# Patient Record
Sex: Male | Born: 1985 | Marital: Married | Smoking: Current every day smoker
Health system: Southern US, Community
[De-identification: ages and names within clinical notes are randomized; demographics above are authoritative.]

## PROBLEM LIST (undated history)

## (undated) HISTORY — PX: HERNIA REPAIR: SHX51

---

## 2013-01-18 ENCOUNTER — Emergency Department (HOSPITAL_COMMUNITY)
Admission: EM | Admit: 2013-01-18 | Discharge: 2013-01-18 | Disposition: A | Discharge status: Home / Self Care | Attending: Emergency Medicine | Admitting: Emergency Medicine

## 2013-01-18 ENCOUNTER — Encounter (HOSPITAL_COMMUNITY): Payer: Self-pay | Admitting: *Deleted

## 2013-01-18 DIAGNOSIS — B309 Viral conjunctivitis, unspecified: Secondary | ICD-10-CM

## 2013-01-18 DIAGNOSIS — F17200 Nicotine dependence, unspecified, uncomplicated: Secondary | ICD-10-CM | POA: Insufficient documentation

## 2013-01-18 DIAGNOSIS — H579 Unspecified disorder of eye and adnexa: Secondary | ICD-10-CM | POA: Insufficient documentation

## 2013-01-18 NOTE — ED Notes (Signed)
Attempted to perform visual acuity test however pt isn't wearing his contacts or glasses and therefore wasn't able to read any line besides the 20/200 line in either eye. In fact , pt states that the letter of the 20/200 was of clearer visibility to his right eye than his left.

## 2013-01-18 NOTE — ED Notes (Signed)
Pt was at home watching TV when he felt his right eye itching. He wears contacts, and casually scratched his eye but it continued to irritate him and so pt removed contact from right eye and flushed eye with saline. An hour later the pt noticed his eye continued to itch and was swollen. Pt denies having contacted anything that may have caused the irritation to his knowledge and he doesn't feel like he injuried eye at some point to his recollection. Pt denies visual changes to either eye.

## 2013-01-18 NOTE — ED Provider Notes (Signed)
History  This chart was scribed for Paul Aguilar, a non-physician practitioner working with Loren Racer, MD by Lewanda Rife, ED Scribe. This patient was seen in room WTR6/WTR6 and the patient's care was started at 2220.     CSN: [id number]  Arrival date & time 01/18/13  2131   First MD Initiated Contact with Patient 01/18/13 2207      Chief Complaint  Patient presents with  . Eye Pain    (Consider location/radiation/quality/duration/timing/severity/associated sxs/prior treatment) The history is provided by the patient.   HPI Comments: Travion Ke is a 27 y.o. male who presents to the Emergency Department complaining of constant worsening pruritic right eye acute onset 2 hours PTA after removing prescription contacts. Reports clear drainage, increased tearing, and injected right eye. Denies recent injury to eye, fever, known foreign bodies in eye, change in vision, diplopia, and photophobia indirect and direct. Denies alleviating or aggravating factors. Denies taking any medications prior to arrival to treat symptoms.  History reviewed. No pertinent past medical history.  History reviewed. No pertinent past surgical history.  No family history on file.  History  Substance Use Topics  . Smoking status: Current Every Day Smoker -- 0.12 packs/day for 7 years    Types: Cigarettes  . Smokeless tobacco: Never Used  . Alcohol Use: No      Review of Systems A complete 10 system review of systems was obtained and all systems are negative except as noted in the HPI and PMH.    Allergies  Review of patient's allergies indicates no known allergies.  Home Medications  No current outpatient prescriptions on file.  BP 114/65  Pulse 72  Temp(Src) 98.1 F (36.7 C) (Oral)  Resp 18  Ht 5\' 8"  (1.727 m)  Wt 185 lb (83.915 kg)  BMI 28.14 kg/m2  SpO2 100%  Physical Exam  Nursing note and vitals reviewed. Constitutional: He is oriented to person, place, and  time. He appears well-developed and well-nourished. No distress.  HENT:  Head: Normocephalic and atraumatic.  Eyes: EOM and lids are normal. Pupils are equal, round, and reactive to light. No foreign bodies found. Right eye exhibits no discharge and no exudate. No foreign body present in the right eye. Left eye exhibits no discharge. Right conjunctiva is injected. Left conjunctiva is not injected.  Clear drainage noted. No fluorescence uptake of the right eye.  No corneal abrasion. IOP of right eye 15  Neck: Neck supple. No tracheal deviation present.  Cardiovascular: Normal rate and regular rhythm.   Pulmonary/Chest: Effort normal and breath sounds normal. No respiratory distress.  Musculoskeletal: Normal range of motion.  Neurological: He is alert and oriented to person, place, and time.  Skin: Skin is warm and dry.  Psychiatric: He has a normal mood and affect. His behavior is normal.    ED Course  Procedures (including critical care time) Medications - No data to display  Labs Reviewed - No data to display No results found.   No diagnosis found.    MDM  Patient presenting with injection of the right eye.  No purulent drainage or crusting of the eye.  Vision of the right eye same as the vision of the left eye.  IOP of the right eye is 15.  No corneal abrasion.  Symptoms most consistent with Viral Conjunctivitis.  Patient given referral to Ophthalmology.    I personally performed the services described in this documentation, which was scribed in my presence. The recorded information has been reviewed  and is accurate.   Pascal Lux Redrock, PA-C 01/18/13 2259

## 2013-01-18 NOTE — ED Provider Notes (Signed)
Medical screening examination/treatment/procedure(s) were performed by non-physician practitioner and as supervising physician I was immediately available for consultation/collaboration.   Merril Isakson, MD 01/18/13 2340 

## 2013-10-20 ENCOUNTER — Encounter (HOSPITAL_COMMUNITY): Payer: Self-pay | Admitting: Emergency Medicine

## 2013-10-20 ENCOUNTER — Emergency Department (HOSPITAL_COMMUNITY)
Admission: EM | Admit: 2013-10-20 | Discharge: 2013-10-20 | Discharge status: Against Medical Advice | Attending: Emergency Medicine | Admitting: Emergency Medicine

## 2013-10-20 DIAGNOSIS — R197 Diarrhea, unspecified: Secondary | ICD-10-CM | POA: Insufficient documentation

## 2013-10-20 DIAGNOSIS — R1110 Vomiting, unspecified: Secondary | ICD-10-CM | POA: Insufficient documentation

## 2013-10-20 DIAGNOSIS — R109 Unspecified abdominal pain: Secondary | ICD-10-CM | POA: Insufficient documentation

## 2013-10-20 DIAGNOSIS — F17200 Nicotine dependence, unspecified, uncomplicated: Secondary | ICD-10-CM | POA: Insufficient documentation

## 2013-10-20 DIAGNOSIS — J029 Acute pharyngitis, unspecified: Secondary | ICD-10-CM | POA: Insufficient documentation

## 2013-10-20 LAB — CBC WITH DIFFERENTIAL/PLATELET
BASOS PCT: 0 % (ref 0–1)
Basophils Absolute: 0 10*3/uL (ref 0.0–0.1)
EOS ABS: 0.1 10*3/uL (ref 0.0–0.7)
Eosinophils Relative: 1 % (ref 0–5)
HCT: 43.4 % (ref 39.0–52.0)
Hemoglobin: 15.1 g/dL (ref 13.0–17.0)
Lymphocytes Relative: 18 % (ref 12–46)
Lymphs Abs: 1.6 10*3/uL (ref 0.7–4.0)
MCH: 30.1 pg (ref 26.0–34.0)
MCHC: 34.8 g/dL (ref 30.0–36.0)
MCV: 86.6 fL (ref 78.0–100.0)
Monocytes Absolute: 1 10*3/uL (ref 0.1–1.0)
Monocytes Relative: 11 % (ref 3–12)
NEUTROS PCT: 70 % (ref 43–77)
Neutro Abs: 6.3 10*3/uL (ref 1.7–7.7)
PLATELETS: 179 10*3/uL (ref 150–400)
RBC: 5.01 MIL/uL (ref 4.22–5.81)
RDW: 13.3 % (ref 11.5–15.5)
WBC: 9 10*3/uL (ref 4.0–10.5)

## 2013-10-20 LAB — URINALYSIS, ROUTINE W REFLEX MICROSCOPIC
Bilirubin Urine: NEGATIVE
GLUCOSE, UA: NEGATIVE mg/dL
Hgb urine dipstick: NEGATIVE
KETONES UR: NEGATIVE mg/dL
LEUKOCYTES UA: NEGATIVE
Nitrite: NEGATIVE
PH: 5.5 (ref 5.0–8.0)
Protein, ur: NEGATIVE mg/dL
SPECIFIC GRAVITY, URINE: 1.034 — AB (ref 1.005–1.030)
Urobilinogen, UA: 0.2 mg/dL (ref 0.0–1.0)

## 2013-10-20 LAB — COMPREHENSIVE METABOLIC PANEL
ALBUMIN: 4.1 g/dL (ref 3.5–5.2)
ALK PHOS: 46 U/L (ref 39–117)
ALT: 39 U/L (ref 0–53)
AST: 21 U/L (ref 0–37)
BUN: 15 mg/dL (ref 6–23)
CO2: 22 mEq/L (ref 19–32)
Calcium: 8.9 mg/dL (ref 8.4–10.5)
Chloride: 101 mEq/L (ref 96–112)
Creatinine, Ser: 0.83 mg/dL (ref 0.50–1.35)
GFR calc Af Amer: >90 mL/min (ref >90)
GFR calc non Af Amer: >90 mL/min (ref >90)
Glucose, Bld: 97 mg/dL (ref 70–99)
POTASSIUM: 3.8 meq/L (ref 3.7–5.3)
SODIUM: 137 meq/L (ref 137–147)
TOTAL PROTEIN: 8.2 g/dL (ref 6.0–8.3)
Total Bilirubin: 0.8 mg/dL (ref 0.3–1.2)

## 2013-10-20 NOTE — ED Notes (Signed)
Pt states since Friday has had fever, lower abdominal pain, vomiting, diarrhea, headache, sorethroat and body aches

## 2013-10-20 NOTE — ED Notes (Signed)
Unable to locate at this time

## 2013-10-20 NOTE — ED Notes (Signed)
Unable to locate patient at this time.

## 2013-10-20 NOTE — ED Notes (Signed)
Patient was called several times and no answer.

## 2014-07-30 ENCOUNTER — Emergency Department (HOSPITAL_COMMUNITY)

## 2014-07-30 ENCOUNTER — Encounter (HOSPITAL_COMMUNITY): Payer: Self-pay | Admitting: Emergency Medicine

## 2014-07-30 ENCOUNTER — Emergency Department (HOSPITAL_COMMUNITY)
Admission: EM | Admit: 2014-07-30 | Discharge: 2014-07-31 | Disposition: A | Discharge status: Home / Self Care | Attending: Emergency Medicine | Admitting: Emergency Medicine

## 2014-07-30 DIAGNOSIS — R0600 Dyspnea, unspecified: Secondary | ICD-10-CM | POA: Diagnosis present

## 2014-07-30 DIAGNOSIS — T7809XA Anaphylactic reaction due to other food products, initial encounter: Secondary | ICD-10-CM | POA: Diagnosis not present

## 2014-07-30 DIAGNOSIS — R062 Wheezing: Secondary | ICD-10-CM | POA: Diagnosis not present

## 2014-07-30 DIAGNOSIS — R0602 Shortness of breath: Secondary | ICD-10-CM | POA: Diagnosis not present

## 2014-07-30 DIAGNOSIS — R600 Localized edema: Secondary | ICD-10-CM | POA: Diagnosis not present

## 2014-07-30 DIAGNOSIS — T782XXA Anaphylactic shock, unspecified, initial encounter: Secondary | ICD-10-CM

## 2014-07-30 DIAGNOSIS — Z720 Tobacco use: Secondary | ICD-10-CM | POA: Diagnosis not present

## 2014-07-30 LAB — BASIC METABOLIC PANEL
Anion gap: 16 — ABNORMAL HIGH (ref 5–15)
BUN: 20 mg/dL (ref 6–23)
CHLORIDE: 103 meq/L (ref 96–112)
CO2: 25 meq/L (ref 19–32)
Calcium: 9.7 mg/dL (ref 8.4–10.5)
Creatinine, Ser: 0.89 mg/dL (ref 0.50–1.35)
GFR calc Af Amer: >90 mL/min (ref >90)
GFR calc non Af Amer: >90 mL/min (ref >90)
GLUCOSE: 104 mg/dL — AB (ref 70–99)
POTASSIUM: 3.9 meq/L (ref 3.7–5.3)
Sodium: 144 mEq/L (ref 137–147)

## 2014-07-30 LAB — CBC
HEMATOCRIT: 45.5 % (ref 39.0–52.0)
HEMOGLOBIN: 15.8 g/dL (ref 13.0–17.0)
MCH: 29.9 pg (ref 26.0–34.0)
MCHC: 34.7 g/dL (ref 30.0–36.0)
MCV: 86 fL (ref 78.0–100.0)
Platelets: 205 10*3/uL (ref 150–400)
RBC: 5.29 MIL/uL (ref 4.22–5.81)
RDW: 13.2 % (ref 11.5–15.5)
WBC: 11.6 10*3/uL — ABNORMAL HIGH (ref 4.0–10.5)

## 2014-07-30 MED ORDER — FAMOTIDINE IN NACL 20-0.9 MG/50ML-% IV SOLN
20.0000 mg | Freq: Once | INTRAVENOUS | Status: AC
  Administered 2014-07-30: 20 mg via INTRAVENOUS
  Filled 2014-07-30: qty 50

## 2014-07-30 MED ORDER — DIPHENHYDRAMINE HCL 50 MG/ML IJ SOLN
50.0000 mg | Freq: Once | INTRAMUSCULAR | Status: AC
  Administered 2014-07-30: 50 mg via INTRAVENOUS
  Filled 2014-07-30: qty 1

## 2014-07-30 MED ORDER — ALBUTEROL SULFATE (2.5 MG/3ML) 0.083% IN NEBU
5.0000 mg | INHALATION_SOLUTION | Freq: Once | RESPIRATORY_TRACT | Status: AC
  Administered 2014-07-30: 5 mg via RESPIRATORY_TRACT
  Filled 2014-07-30: qty 6

## 2014-07-30 MED ORDER — METHYLPREDNISOLONE SODIUM SUCC 125 MG IJ SOLR
125.0000 mg | Freq: Once | INTRAMUSCULAR | Status: AC
  Administered 2014-07-30: 125 mg via INTRAVENOUS
  Filled 2014-07-30: qty 2

## 2014-07-30 MED ORDER — EPINEPHRINE 0.3 MG/0.3ML IJ SOAJ
INTRAMUSCULAR | Status: AC
  Administered 2014-07-30: 21:00:00
  Filled 2014-07-30: qty 0.3

## 2014-07-30 MED ORDER — SODIUM CHLORIDE 0.9 % IV BOLUS (SEPSIS)
1000.0000 mL | INTRAVENOUS | Status: AC
Start: 2014-07-30 — End: 2014-07-30
  Administered 2014-07-30: 1000 mL via INTRAVENOUS

## 2014-07-30 NOTE — ED Provider Notes (Signed)
CSN: [id number]     Arrival date & time 07/30/14  2059 History   First MD Initiated Contact with Patient 07/30/14 2111     Chief Complaint  Patient presents with  . Allergic Reaction     (Consider location/radiation/quality/duration/timing/severity/associated sxs/prior Treatment) Patient is a 28 y.o. male presenting with allergic reaction. The history is provided by the patient.  Allergic Reaction Presenting symptoms: difficulty breathing and swelling   Difficulty breathing:    Severity:  Mild   Onset quality:  Sudden   Duration:  1 hour   Timing:  Constant   Progression:  Unchanged Swelling:    Location: lips.   Onset quality:  Sudden   Duration:  1 hour   Timing:  Constant   Progression:  Unchanged   Chronicity:  New Severity:  Mild Prior allergic episodes:  No prior episodes Context: food   Relieved by:  Nothing Worsened by:  Nothing tried Ineffective treatments: benadryl po pta.   History reviewed. No pertinent past medical history. Past Surgical History  Procedure Laterality Date  . Hernia repair     No family history on file. History  Substance Use Topics  . Smoking status: Current Every Day Smoker -- 0.12 packs/day for 7 years    Types: Cigarettes  . Smokeless tobacco: Never Used  . Alcohol Use: No    Review of Systems  Constitutional: Negative for fever.  HENT: Negative for drooling and rhinorrhea.        Facial swelling  Eyes: Negative for pain.  Respiratory: Positive for shortness of breath. Negative for cough.   Cardiovascular: Negative for chest pain and leg swelling.  Gastrointestinal: Negative for nausea, vomiting, abdominal pain and diarrhea.  Genitourinary: Negative for dysuria and hematuria.  Musculoskeletal: Negative for gait problem and neck pain.  Skin: Negative for color change.  Neurological: Negative for numbness and headaches.  Hematological: Negative for adenopathy.  Psychiatric/Behavioral: Negative for behavioral problems.  All  other systems reviewed and are negative.     Allergies  Review of patient's allergies indicates no known allergies.  Home Medications   Prior to Admission medications   Not on File   BP 136/105 mmHg  Pulse 96  Temp(Src) 98 F (36.7 C) (Oral)  Resp 34  SpO2 100% Physical Exam  Constitutional: He is oriented to person, place, and time. He appears well-developed and well-nourished.  HENT:  Head: Normocephalic and atraumatic.  Right Ear: External ear normal.  Left Ear: External ear normal.  Nose: Nose normal.  Mouth/Throat: Oropharynx is clear and moist. No oropharyngeal exudate.  Mild swelling of the lips. No obvious tongue or oropharyngeal swelling.  Eyes: Conjunctivae and EOM are normal. Pupils are equal, round, and reactive to light.  Neck: Normal range of motion. Neck supple.  Cardiovascular: Normal rate, regular rhythm, normal heart sounds and intact distal pulses.  Exam reveals no gallop and no friction rub.   No murmur heard. Pulmonary/Chest: He is in respiratory distress. He has wheezes (diffuse mild wheezing).  Abdominal: Soft. Bowel sounds are normal. He exhibits no distension. There is no tenderness. There is no rebound and no guarding.  Musculoskeletal: Normal range of motion. He exhibits no edema or tenderness.  Neurological: He is alert and oriented to person, place, and time.  Skin: Skin is warm and dry.  Psychiatric: He has a normal mood and affect. His behavior is normal.  Nursing note and vitals reviewed.   ED Course  Procedures (including critical care time) Labs Review Labs Reviewed  CBC - Abnormal; Notable for the following:    WBC 11.6 (*)    All other components within normal limits  BASIC METABOLIC PANEL - Abnormal; Notable for the following:    Glucose, Bld 104 (*)    Anion gap 16 (*)    All other components within normal limits    Imaging Review Dg Chest Port 1 View  07/30/2014   CLINICAL DATA:  Extreme shortness of breath today.  EXAM:  PORTABLE CHEST - 1 VIEW  COMPARISON:  None.  FINDINGS: The heart size and mediastinal contours are within normal limits. Both lungs are clear. The visualized skeletal structures are unremarkable.  IMPRESSION: No active disease.   Electronically Signed   By: Burman NievesWilliam  Stevens M.D.   On: 07/30/2014 21:33     EKG Interpretation   Date/Time:  Thursday July 30 2014 21:05:57 EST Ventricular Rate:  98 PR Interval:  142 QRS Duration: 78 QT Interval:  336 QTC Calculation: 429 R Axis:   67 Text Interpretation:  Sinus rhythm Confirmed by Jorgeluis Gurganus  MD, Ankita Newcomer  ([id number]) on 07/30/2014 9:16:29 PM     CRITICAL CARE Performed by: Purvis SheffieldHARRISON, Daemyn Gariepy, S Total critical care time: 30 min Critical care time was exclusive of separately billable procedures and treating other patients. Critical care was necessary to treat or prevent imminent or life-threatening deterioration. Critical care was time spent personally by me on the following activities: development of treatment plan with patient and/or surrogate as well as nursing, discussions with consultants, evaluation of patient's response to treatment, examination of patient, obtaining history from patient or surrogate, ordering and performing treatments and interventions, ordering and review of laboratory studies, ordering and review of radiographic studies, pulse oximetry and re-evaluation of patient's condition.  MDM   Final diagnoses:  SOB (shortness of breath)  Anaphylaxis, initial encounter    9:16 PM 28 y.o. male who presents with anaphylaxis. The patient states that he ate some fried chicken and Pinto beans around 7:30 PM this evening. He states that he began having some lip swelling around 8 PM and developed shortness of breath at that time. He is able to handle his secretions. He is afebrile and tachypneic here. He denies any known allergies. He took 2 Benadryl prior to arrival. [facility], Pepcid, slight Medrol, and EpiPen. We'll  also give albuterol in the give and wheezing on exam.  Pt's resp sx have resolved. Mild lip swelling remains. Return precautions discussed. Will send home on steroids, benadryl prn, and Rx for epipen. Plan for obs for 1 more hour. Pt warned about biphasic rxn's. Dr. Norlene Campbelltter to d/c in 1 hr if pt continues to appear well.     Purvis SheffieldForrest Tanayia Wahlquist, MD 07/31/14 2107

## 2014-07-30 NOTE — ED Notes (Signed)
Pt states he started having difficulty breathing and facial swelling noted after eating fried chicken around 8pm  Pt states he took 2 benadryl tablets prior to coming here  Denies any problems swallowing

## 2014-07-31 MED ORDER — PREDNISONE 20 MG PO TABS: ORAL_TABLET | ORAL | Status: DC

## 2014-07-31 MED ORDER — EPINEPHRINE 0.3 MG/0.3ML IJ SOAJ: 0.3000 mg | Freq: Once | INTRAMUSCULAR | Status: AC

## 2014-07-31 NOTE — ED Notes (Signed)
Patient sleeping but is arousable. Patient wife states her husband had a couple bites of chicken and pinto beans at [employer] around 2000 and began to have oral swelling with SOB. Patient took 2 Benadryl PTA. Patient with occasional jerking motions at this time. Patient states he feels better. Patient upper lip remains swollen. Patient denies any respiratory difficulties.

## 2014-07-31 NOTE — ED Provider Notes (Signed)
Care assumed from Dr. Romeo AppleHarrison and changes shift.  Patient with allergic reaction, has received epinephrine.  Patient reassessed, he has mild swelling to the upper lip.  He denies any difficulties breathing or swallowing.  Wife requests work note.  Olivia Mackielga M Ruthmary Occhipinti, MD 07/31/14 [phone]

## 2014-07-31 NOTE — Discharge Instructions (Signed)
Anaphylactic Reaction An anaphylactic reaction is a sudden, severe allergic reaction. It affects the whole body. It can be life threatening. You may need to stay in the hospital.  [place] a medical bracelet or necklace that lists your allergy.  Carry your allergy kit or medicine shot to treat severe allergic reactions with you. These can save your life.  Do not drive until medicine from your shot has worn off, unless your doctor says it is okay.  If you have hives or a rash:  Take medicine as told by your doctor.  You may take over-the-counter antihistamine medicine.  Place cold cloths on your skin. Take baths in cool water. Avoid hot baths and hot showers. GET HELP RIGHT AWAY IF:   Your mouth is puffy (swollen), or you have trouble breathing.  You start making whistling sounds when you breathe (wheezing).  You have a tight feeling in your chest or throat.  You have a rash, hives, puffiness, or itching on your body.  You throw up (vomit) or have watery poop (diarrhea).  You feel dizzy or pass out (faint).  You think you are having an allergic reaction.  You have new symptoms. This is an emergency. Use your medicine shot or allergy kit as told. Call your local emergency services (911 in U.S[place] Even if you feel better after the shot, you need to go to the hospital emergency department. MAKE SURE YOU:   Understand these instructions.  Will watch your condition.  Will get help right away if you are not doing well or get worse. Document Released: 02/14/2008 Document Revised: 02/27/2012 Document Reviewed: 11/29/2011 [facility] Patient Information 2015 [place], [place]. This information is not intended to replace advice given to you by your health care provider. Make sure you discuss any questions you have with your health care provider.   Emergency Department Resource Guide 1) Find a Doctor and Pay Out of Pocket Although you won't have to find out who is covered by your  insurance plan, it is a good idea to ask around and get recommendations. You will then need to call the office and see if the doctor you have chosen will accept you as a new patient and what types of options they offer for patients who are self-pay. Some doctors offer discounts or will set up payment plans for their patients who do not have insurance, but you will need to ask so you aren't surprised when you get to your appointment.  2) Contact Your Local Health Department Not all health departments have doctors that can see patients for sick visits, but many do, so it is worth a call to see if yours does. If you don't know where your local health department is, you can check in your phone book. The CDC also has a tool to help you locate your state's health department, and many state websites also have listings of all of their local health departments.  3) Find a [facility] If your illness is not likely to be very severe or complicated, you may want to try a walk in clinic. These are popping up all over the country in pharmacies, drugstores, and shopping centers. They're usually staffed by nurse practitioners or physician assistants that have been trained to treat common illnesses and complaints. They're usually fairly quick and inexpensive. However, if you have serious medical issues or chronic medical problems, these are probably not your best option.  No Primary Care Doctor: - Call Health Connect at  [phone] -  they can help you locate a primary care doctor that  accepts your insurance, provides certain services, etc. - Physician Referral Service- [phone]  Chronic Pain Problems: Organization         Address  Phone   Notes  [facility]  [phone] Patients need to be referred by their primary care doctor.   Medication Assistance: Organization         Address  Phone   Notes  [facility] Medication [facility] [address] [place], [place] [address] [phone] --Must be a resident of [facility] -- Must have NO insurance coverage whatsoever (no Medicaid/ Medicare, etc.) -- The pt. MUST have a primary care doctor that directs their care regularly and follows them in the community   MedAssist  [phone]   [employer]  [phone]    Agencies that provide inexpensive medical care: Organization         Address  Phone   Notes  [place]  [phone]   [facility]    [phone]   [facility] [place], [place] [address] [phone]   [place] [address], [place] [phone]   Planned Parenthood    [phone]   [facility]    [phone]   [place] and [place] [address], [place] Phone:  [phone], Fax:  [phone] Hours of Operation:  9 am - 6 pm, M-F.  Also accepts Medicaid/Medicare and self-pay.  [facility] for [place] [address], [address], [place] Phone: [phone], Fax: [phone]. Hours of Operation:  8:30 am - 5:30 pm, M-F.  Also accepts Medicaid and self-pay.  [facility] [address], [place] Phone: [phone]   [place], [place], [place] [phone], Ext. [phone] Mondays & Thursdays: 7-9 AM.  First 15 patients are seen on a first come, first serve basis.    [place] Providers:  Organization         Address  Phone   Notes  [facility] [address], [place] [phone] Also accepts self-pay patients.  [facility] [address], [place]  [phone]   [place], [address], [place] [phone]   [facility] [address] [place] [phone]   Lucianne Lei [address], [place]   [phone] Only accepts [place] Access [place] patients after they have their name applied to their card.   Self-Pay (no insurance) in [facility]:  Organization         Address  Phone   Notes  Sickle Cell Patients, [facility] [place] [phone]   [facility] [place] [phone]   [facility] [place]  [place], [place], [place] [phone]   [facility]/Dr. Osei-Bonsu  [address] [place] or [address], [place] [phone] Phone number for both [place] and [place] locations is the same.  Urgent Medical and [facility] [address], [place] [phone]   [facility] [place] or [place]  [facility] [address] [phone]   [facility] [place] [phone], phone; [phone], fax Sees patients 1st and 3rd Saturday of every month.  Must not qualify for public or private insurance (i.e. Medicaid, Medicare, [place] Health Choice, Veterans' Benefits)  Household income should be no more than 200% of the poverty level The clinic cannot treat you if you are pregnant or think you are pregnant  Sexually transmitted diseases are not treated at the clinic.    Dental Care: Organization         Address  Phone  Notes  [facility] Department of [facility] [place] [phone] Accepts children up to age 43 who are enrolled in [place] or [place]; pregnant women with a Medicaid card; and children who have applied for Medicaid or [place] Health Choice, but were declined, whose parents can pay a reduced fee at time of service.  [facility] Department of [facility]  [address], [place] [phone] Accepts children up to age 15 who are enrolled  in [place] or [place]; pregnant women with a Medicaid card; and children who have applied for Medicaid or  Health Choice, but were declined, whose parents can pay a reduced fee at time of service.  [place] Adult Dental Access PROGRAM  [place] [phone] Patients are seen by appointment only. Walk-ins are not accepted. [place] will see patients 12 years of age and older. Monday - Tuesday (8am-5pm) Most Wednesdays (8:30-5pm) $30 per visit, cash only  [facility] Adult Dental Access PROGRAM  [address], [facility] [phone] Patients are seen by appointment only. Walk-ins are not accepted. [place] will see patients 47 years of age and older. One Wednesday Evening (Monthly: Volunteer Based).  $30 per visit, cash only  [place]  [phone] for adults; Children under age 81, call Graduate Pediatric Dentistry at [phone]. Children aged 50-14, please call [phone] to request a pediatric application.  Dental services are provided in all areas of dental care including fillings, crowns and bridges, complete and partial dentures, implants, gum treatment, root canals, and extractions. Preventive care is also provided. Treatment is provided to both adults and children. Patients are selected via a lottery and there is often a waiting list.   [facility] [address] [place]  [phone] [email or web address]   [facility] [address] [place], [place] [phone], Ext. [phone] Second and Fourth Thursday of each month, opens at 6:30 AM; Clinic ends at 9 AM.  Patients are seen on a first-come first-served basis, and a limited number are seen during each clinic.   [facility]  [address] Hillard Danker [place], [place] [phone]   Eligibility Requirements You must have lived in [place], [place], or [place] counties for at least the last three months.   You cannot be  eligible for state or federal sponsored [employer], including [employer], [place], or [employer].   You generally cannot be eligible for healthcare insurance through your employer.    How to apply: Eligibility screenings are held every Tuesday and Wednesday afternoon from 1:00 pm until 4:00 pm. You do not need an appointment for the interview!  [facility] [address], [place], [place]   [place]  [place]  Health Department  [place]  [phone]    Behavioral Health Resources in the Community: Intensive Outpatient Programs Organization         Address  Phone  Notes  [place]. [address], [place], [place] [phone]   [facility]patient [address] [place], [place]   ADS: Alcohol & Drug Svcs [address], [place], [place]   [place] [address],  [place], [place] or [phone]   Substance Abuse Resources Organization         Address  Phone  Notes  Alcohol and Drug Services  [phone]   [place]  [phone]   The [place]   Chinita Pester  [phone]   Residential & Outpatient Substance Abuse Program  [phone]   Psychological Services Organization         Address  Phone  Notes  [facility] [place]  Reserve  [phone]   [place] [address], [place] or [phone]    Mobile Crisis Teams Organization         Address  Phone  Notes  [facility], Mobile Crisis Care Unit  [phone]   [facility]  [address] [place], [place]   [place] Levels [address] [place] [phone]    Self-Help/Support Groups Organization          Address  Phone             Notes  [place]. of [place] - variety of support groups  [place] Call for more information  Narcotics Anonymous (NA), [facility] [address], [employer] [place]  2 meetings at this location   Special educational needs teacher         Address  Phone  Notes  [facility] Residential Treatment [place],    [place]  [phone]   [facility]  [address], [place] [phone], [place], [place]   [place], [place] [phone] Admissions: 8am-3pm M-F  Incentives Substance [place] [address],    [place], [place] [phone]   [facility] [address] [place], [place], [place]   [facility] [address]  [place], [place]   Insight Programs - Intensive Outpatient [address], [place], [place]   [facility] ([place].,  [place], [place] [phone] or [phone]   [facility] Services (RTS) [address], [place], [place] Accepts Medicaid  [facility] [place] [address],  [place] [phone] Substance Abuse/Addiction Treatment   [facility] Organization         Address  Phone  Notes  [facility]  [phone]   Domenic Schwab, PhD [address] Arlis Porta [place], [place]   [phone] or [phone]   [place], [place] [phone]   [place] [address], [place], [place] [phone] Insurance/Medicaid/sponsorship through [employer] and Families [address]  [place], [place] [phone] [place], [place] [phone]    Dr. Adele Schilder  [phone]   [facility] Dept. 1) [address] [place] 2) [place] 3)  [place] [address], [place] [phone]  [phone]   [place] [phone] or [phone] (After Hours)

## 2014-10-12 ENCOUNTER — Emergency Department (HOSPITAL_COMMUNITY)
Admission: EM | Admit: 2014-10-12 | Discharge: 2014-10-12 | Disposition: A | Discharge status: Home / Self Care | Attending: Emergency Medicine | Admitting: Emergency Medicine

## 2014-10-12 ENCOUNTER — Encounter (HOSPITAL_COMMUNITY): Payer: Self-pay | Admitting: Emergency Medicine

## 2014-10-12 DIAGNOSIS — R21 Rash and other nonspecific skin eruption: Secondary | ICD-10-CM | POA: Diagnosis not present

## 2014-10-12 DIAGNOSIS — Z720 Tobacco use: Secondary | ICD-10-CM | POA: Diagnosis not present

## 2014-10-12 DIAGNOSIS — Z79899 Other long term (current) drug therapy: Secondary | ICD-10-CM | POA: Diagnosis not present

## 2014-10-12 MED ORDER — HYDROXYZINE HCL 25 MG PO TABS
25.0000 mg | ORAL_TABLET | Freq: Once | ORAL | Status: AC
  Administered 2014-10-12: 25 mg via ORAL
  Filled 2014-10-12: qty 1

## 2014-10-12 MED ORDER — PREDNISONE 20 MG PO TABS: 20.0000 mg | ORAL_TABLET | Freq: Every day | ORAL | Status: AC

## 2014-10-12 MED ORDER — HYDROXYZINE HCL 25 MG PO TABS: 25.0000 mg | ORAL_TABLET | Freq: Four times a day (QID) | ORAL | Status: AC

## 2014-10-12 NOTE — Discharge Instructions (Signed)
Call for a follow up appointment with a Family or Primary Care Provider.  Return if Symptoms worsen.   Take medication as prescribed.  You can take benadryl or vistaril for itching discomfort, do not take both.

## 2014-10-12 NOTE — ED Notes (Signed)
Per pt, states rash and small bumps since Sat.-on back, torso, and underarms

## 2014-10-12 NOTE — ED Provider Notes (Signed)
CSN: [id number]     Arrival date & time 10/12/14  0944 History   First MD Initiated Contact with Patient 10/12/14 1041     Chief Complaint  Patient presents with  . Rash     (Consider location/radiation/quality/duration/timing/severity/associated sxs/prior Treatment) HPI Comments: The patient is a 29 year old male no known allergies presents emergency department the chief complaint of rash for 2 days. Patient reports to erythemic bumps on back on Saturday. He reports similar lesions under bilateral arms, resolved. He reports persistent lesion on left upper back. He reports associated pruritus. Patient denies known recent contact. Specifically denies new soaps, lotions, detergents, animal exposure, bug exposure, food exposure, similar rash on other family members. Pt denies similar rash in the past. Patient denies perioral paresthesias, throat or tongue swelling, dyspnea.  Reports history of perioral discomfort and throat swelling after [employer], denies similar symptoms.  Patient is a 29 y.o. male presenting with rash. The history is provided by the patient. No language interpreter was used.  Rash Associated symptoms: no fever, no shortness of breath, no sore throat and not wheezing     History reviewed. No pertinent past medical history. Past Surgical History  Procedure Laterality Date  . Hernia repair     No family history on file. History  Substance Use Topics  . Smoking status: Current Every Day Smoker -- 0.12 packs/day for 7 years    Types: Cigarettes  . Smokeless tobacco: Never Used  . Alcohol Use: No    Review of Systems  Constitutional: Negative for fever and chills.  HENT: Negative for facial swelling, sore throat and trouble swallowing.   Respiratory: Negative for cough, shortness of breath, wheezing and stridor.   Skin: Positive for rash.      Allergies  Review of patient's allergies indicates no known allergies.  Home Medications   Prior to Admission  medications   Medication Sig Start Date End Date Taking? Authorizing Provider  diphenhydrAMINE (BENADRYL) 25 MG tablet Take 50 mg by mouth once.    Historical Provider, MD  EPINEPHrine 0.3 mg/0.3 mL IJ SOAJ injection Inject 0.3 mLs (0.3 mg total) into the muscle once. 07/31/14   Purvis SheffieldForrest Harrison, MD  predniSONE (DELTASONE) 20 MG tablet Take 3 tablets on day 1. Take 2 tablets on day 2. Take 1 tablet on day 3. 07/31/14   Purvis SheffieldForrest Harrison, MD  sodium chloride (OCEAN) 0.65 % SOLN nasal spray Place 1 spray into both nostrils as needed for congestion.    Historical Provider, MD   BP 124/75 mmHg  Pulse 75  Temp(Src) 98 F (36.7 C) (Oral)  Resp 18  SpO2 100% Physical Exam  Constitutional: He is oriented to person, place, and time. He appears well-developed and well-nourished. No distress.  HENT:  Head: Normocephalic and atraumatic.  Neck: Neck supple.  Pulmonary/Chest: Effort normal. No respiratory distress.  Patient is able to speak in complete sentences.   Neurological: He is oriented to person, place, and time.  Skin: Skin is warm and dry. Rash noted.     Erythremic lesion to left upper back, no excoriation noted, no drainage. Mild swelling, noted increase in warmth to touch.  Psychiatric: He has a normal mood and affect. His behavior is normal.  Nursing note and vitals reviewed.   ED Course  Procedures (including critical care time) Labs Review Labs Reviewed - No data to display  Imaging Review No results found.   EKG Interpretation None      MDM   Final diagnoses:  Rash  Patient with pruritic rash, no other complaints. One lesion on upper back, no increase in warmth. Plan to treat with prednisone and Benadryl or Vistaril for symptoms. Plan to follow-up with PCP. Meds given in ED:  Medications  hydrOXYzine (ATARAX/VISTARIL) tablet 25 mg (25 mg Oral Given 10/12/14 1135)    Discharge Medication List as of 10/12/2014 11:28 AM    START taking these medications   Details    hydrOXYzine (ATARAX/VISTARIL) 25 MG tablet Take 1 tablet (25 mg total) by mouth every 6 (six) hours., Starting 10/12/2014, Until Discontinued, Print         Mellody Drown, PA-C 10/12/14 1544  Geoffery Lyons, MD 10/13/14 [phone]

## 2015-01-12 IMAGING — DX DG CHEST 1V PORT
1 series · 1 of 1 positions shown · non-contrast
Comparison: None.

CLINICAL DATA: Extreme shortness of breath today.

EXAM:
PORTABLE CHEST - 1 VIEW

[chest ap]
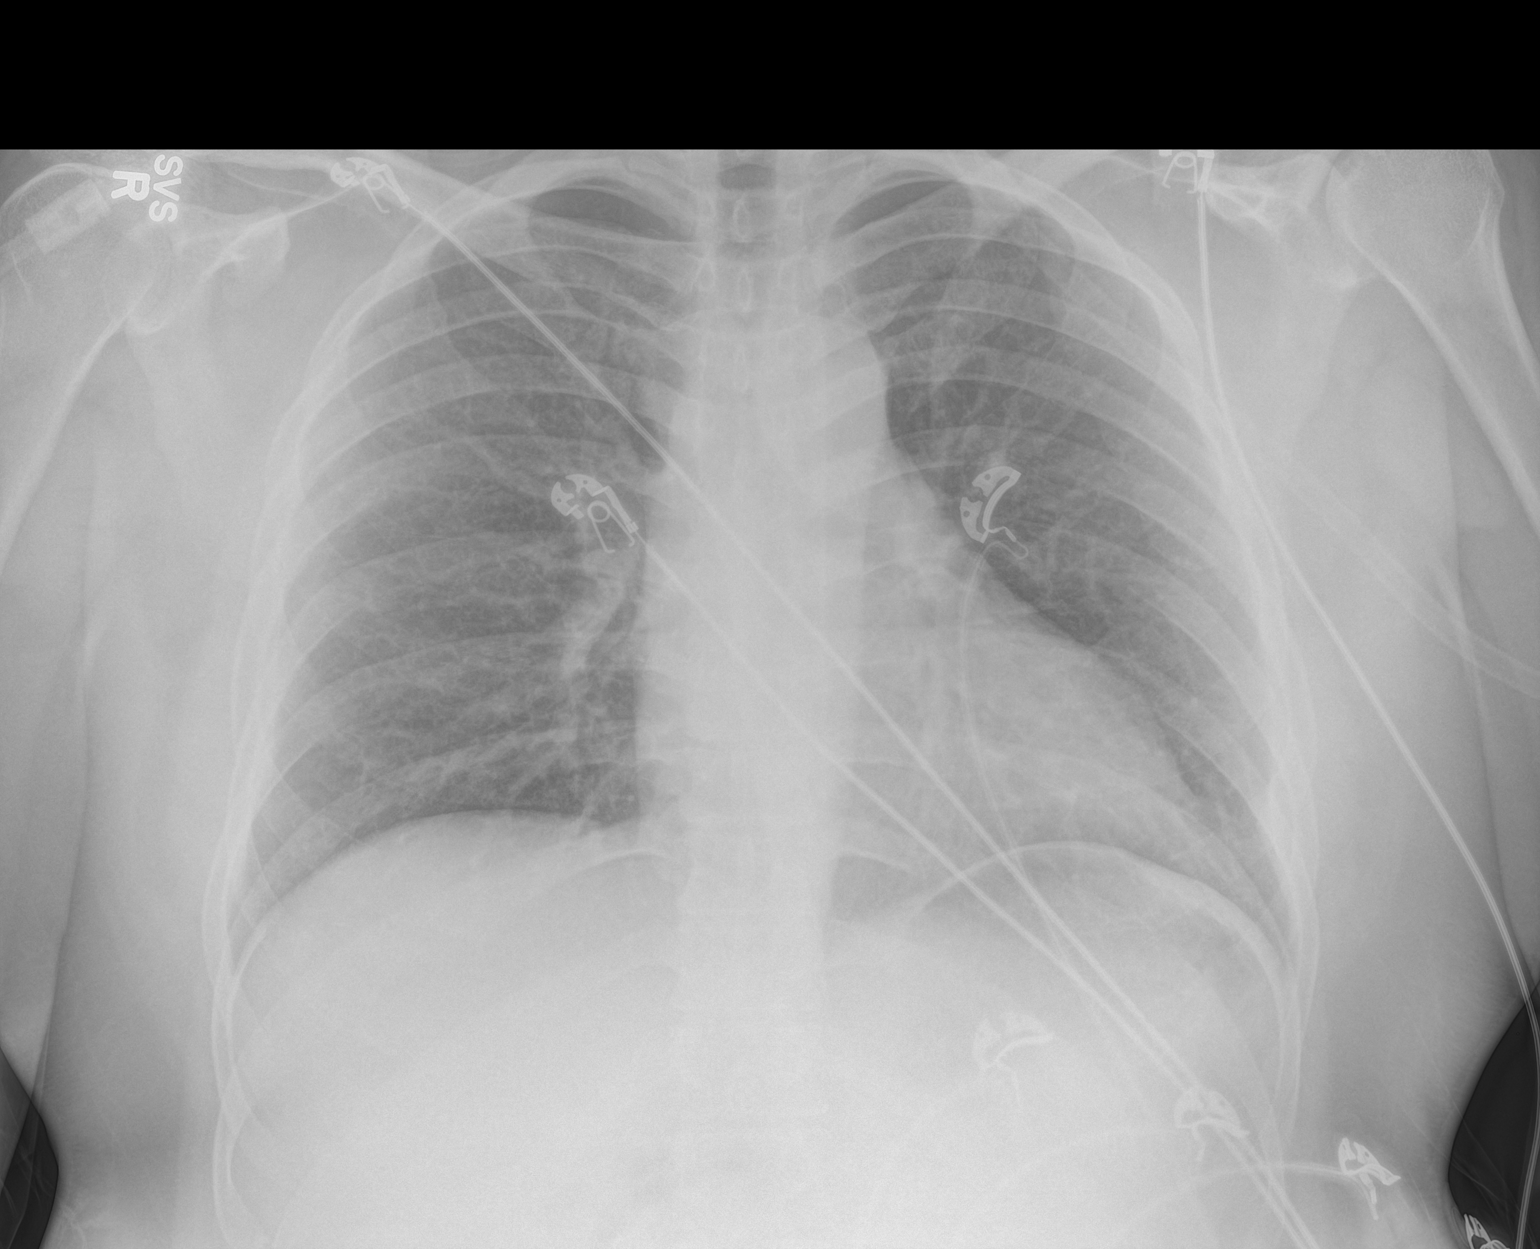

[1 of 1 positions shown; findings below may reference images not displayed]

FINDINGS: The heart size and mediastinal contours are within normal limits.
Both lungs are clear. The visualized skeletal structures are
unremarkable.
IMPRESSION: No active disease.
